# Patient Record
Sex: Male | Born: 1945 | Hispanic: Refuse to answer
Health system: Midwestern US, Academic
[De-identification: ages and names within clinical notes are randomized; demographics above are authoritative.]

---

## 2017-03-27 ENCOUNTER — Encounter: Admit: 2017-03-27 | Discharge: 2017-03-27 | Payer: MEDICARE

## 2017-03-27 DIAGNOSIS — K6289 Other specified diseases of anus and rectum: Principal | ICD-10-CM

## 2017-03-27 MED ORDER — PEG-ELECTROLYTE SOLN 420 GRAM PO SOLR
0 refills | Status: AC
Start: 2017-03-27 — End: ?

## 2017-03-27 NOTE — Telephone Encounter
Left message for pt to call office

## 2017-03-27 NOTE — Telephone Encounter
Spoke with pt and arranged lower EUS at [facility]  03/31/17

## 2017-04-09 ENCOUNTER — Encounter: Admit: 2017-04-09 | Discharge: 2017-04-09 | Payer: MEDICARE

## 2017-04-09 NOTE — Telephone Encounter
-----  Original Message-----  From: Edger HouseMelissa Oropeza-Vail   Sent: Tuesday, April 07, 2017 10:38 AM  To: Bernita Buffyarlene Torres; Harolyn RutherfordMalissa Ryli Standlee  Subject: RE: EUS Request: Joneen BoersDreyer, Marguis DOB 2046/06/24    this was already done 03/25/17    ________________________________________  From: Bernita Buffyarlene Torres  Sent: Tuesday, April 07, 2017 9:42 AM  To: Harolyn RutherfordMalissa Marasia Newhall  Cc: Edger HouseMelissa Oropeza-[place]  Subject: FW: EUS Request: [name], [name] DOB 2046/06/24    For Dr. Milta Deiterslyaee      -----Original Message-----  From: Estevan OaksJenn Eastburn  Sent: Tuesday, April 07, 2017 9:42 AM  To: Bernita Buffyarlene Torres  Cc: PhysicianConsult  Subject: EUS Request: [name], [name] DOB 2046/06/24        This email contains Medical Records and/or Insurance Referral information.  If you have any questions please call ext. [phone] or email me at [email or web address]    Please schedule an appointment for the following patient:    Patient name: Matthew Chang, Matthew Chang  D.O.B: 2046/06/24  Diagnosis: Rectal adenocarcinoma, EUS requested    Patient information or other types of sensitive information included in this e-mail is for the exclusive use of the named recipient. If you are not the designated recipient or a person authorized to receive this document or if you have obtained it in error, be advised that any reading, distribution, use or duplication of it is expressly prohibited. If the e-mail came to you by mistake, please notify the sender by phone immediately [phone]. [facility] is committed to protecting patient and/or other types of sensitive information.    Sending parties are expected to verify that the e-mail address on this document is being sent to is correct and that the stated recipient is authorized to receive the enclosed information.

## 2017-08-11 ENCOUNTER — Encounter: Admit: 2017-08-11 | Discharge: 2017-08-11 | Payer: MEDICARE

## 2017-08-11 ENCOUNTER — Ambulatory Visit: Admit: 2017-08-11 | Discharge: 2017-08-12 | Payer: MEDICARE

## 2017-08-11 DIAGNOSIS — I10 Essential (primary) hypertension: ICD-10-CM

## 2017-08-11 DIAGNOSIS — Z01810 Encounter for preprocedural cardiovascular examination: Principal | ICD-10-CM

## 2021-01-25 ENCOUNTER — Encounter: Admit: 2021-01-25 | Discharge: 2021-01-25 | Payer: MEDICARE

## 2021-01-25 DIAGNOSIS — E785 Hyperlipidemia, unspecified: Secondary | ICD-10-CM

## 2021-01-25 DIAGNOSIS — N19 Unspecified kidney failure: Secondary | ICD-10-CM

## 2021-01-25 DIAGNOSIS — I10 Essential (primary) hypertension: Secondary | ICD-10-CM

## 2021-01-25 DIAGNOSIS — Z87891 Personal history of nicotine dependence: Secondary | ICD-10-CM

## 2022-01-30 IMAGING — CT NECKWO
4 series · 8 of 16 positions shown, 9 images · non-contrast
Comparison: none

[Series 502: c-spine ax 2.00 br60 s3 · axial · 0.22mm/px · z∈[-719,-620]mm · 3 of 100 slices shown, 4 images]
[im 25/100  soft-tissue]
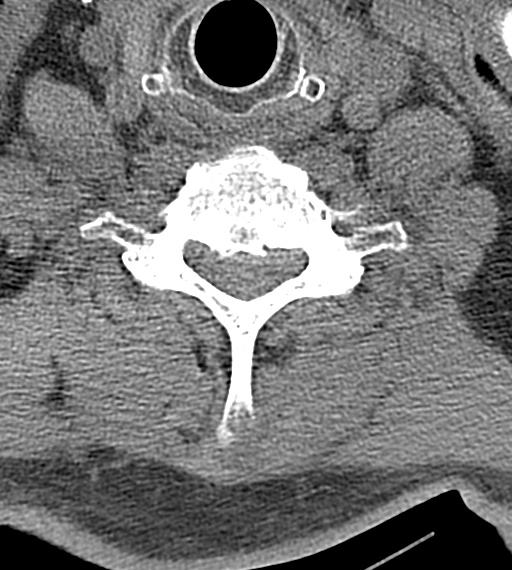
[im 25/100  bone]
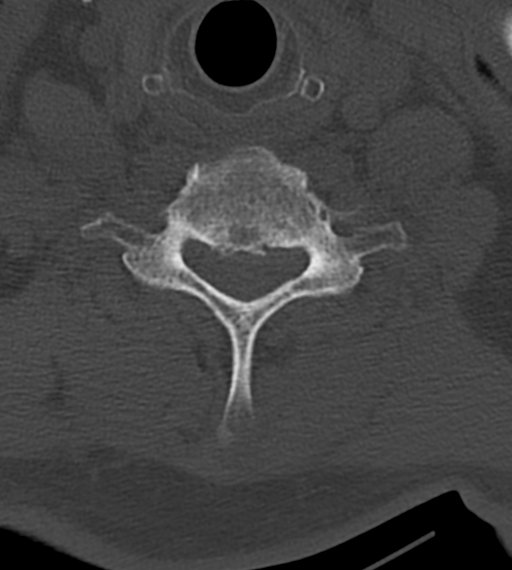
[im 50/100  soft-tissue]
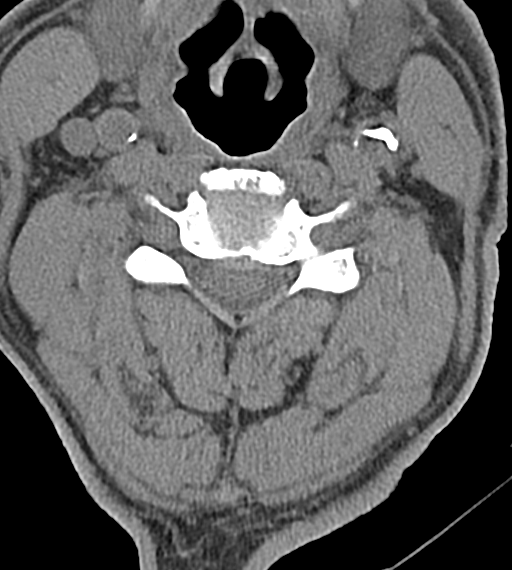
[im 75/100  soft-tissue]
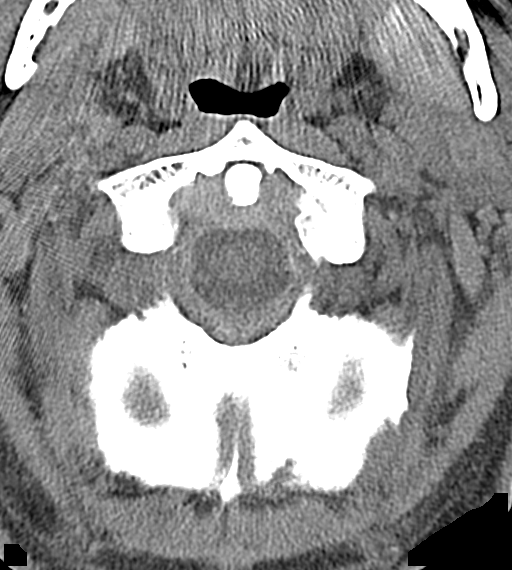

[Series 503: c-spine cor 2.00 br60 s3 · coronal · 0.22mm/px · 1 of 63 slices shown]
[im 32/63  soft-tissue]
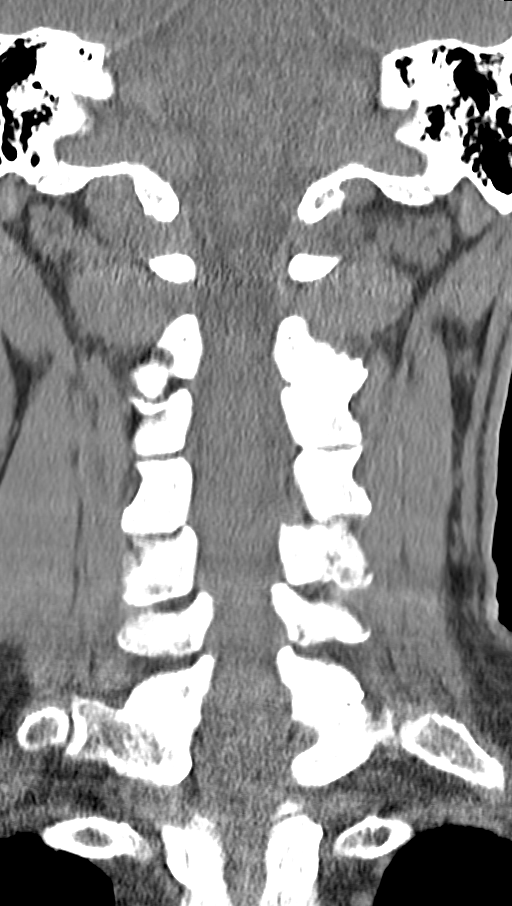

[Series 504: c-spine sag 2.00 br60 s3 · sagittal · 0.25mm/px · 1 of 57 slices shown]
[im 29/57  soft-tissue]
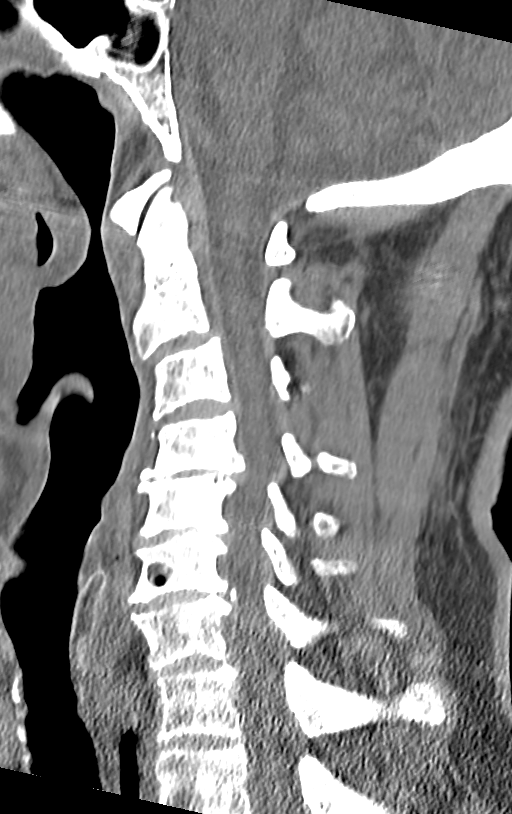

[Series 505: c-spine ax 2.00 br40 s3 · axial · 0.22mm/px · z∈[-718,-623]mm · 3 of 99 slices shown]
[im 25/99  soft-tissue]
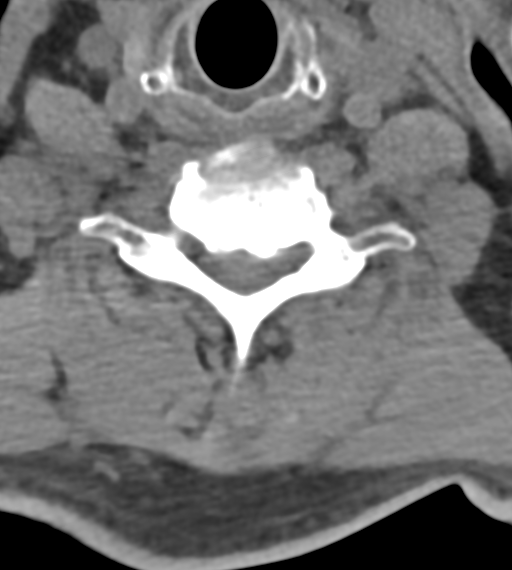
[im 50/99  soft-tissue]
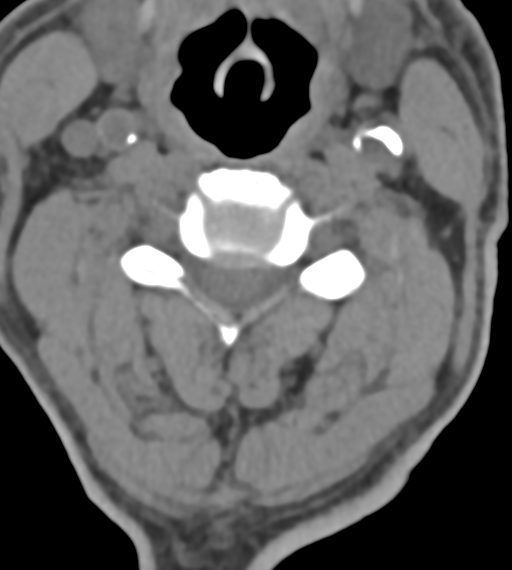
[im 74/99  soft-tissue]
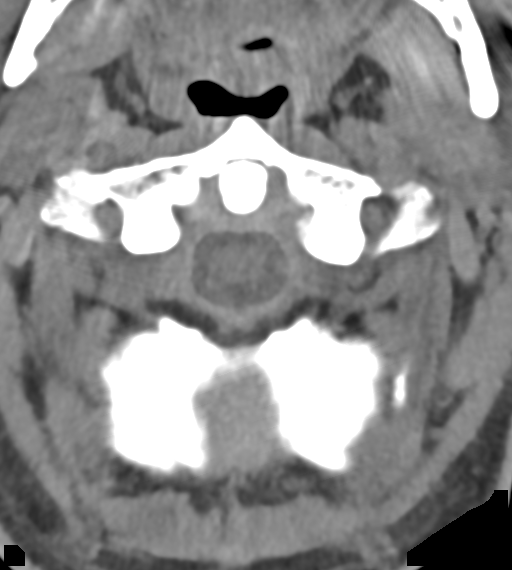

[8 of 16 positions shown; findings below may reference images not displayed]

EXAM

CT HEAD, CT of the cervical spine

INDICATION

Fall
Fall. Trauma. Pt in c-collar. CT/NM 0/0. CF/CS

TECHNIQUE

All CT scans at this facility use dose modulation, iterative reconstruction, and/or weight based
dosing when appropriate to reduce radiation dose to as low as reasonably achievable.

CT of the head without contrast was performed.

# of CT scans in the past year: 0

# of Myocardial perfusion scans this past year: 0

COMPARISONS

None available at the time of dictation

FINDINGS

Parenchyma: No acute hemorrhage.  There is no mass effect, midline shift, or herniation. There is
preservation of the gray white differentiation.

Ventricles / Extra-axial spaces: There is no hydrocephalus.  There are no extra-axial fluid
collections.

Other: The bony structures are intact. Minimal mucosal thickening of the right maxillary sinus.

Vertebral body heights are maintained. There is loss of normal cervical lordosis. Focal kyphosis
centered at C3. Decreased intervertebral disc space at the levels of C2-C3, C3-C4, C4-C5, C5-C6 and
C6-C7. Disc osteophyte complexes are seen at these levels as well. Facet arthropathy is noted. No
significant listhesis. The prevertebral soft tissues are within normal limits.

IMPRESSION
1. No CT evidence of an acute intracranial or cervical spine abnormality.
2. Severe degenerative disc disease within the cervical spine.

Tech Notes:

Fall. Trauma. Pt in c-collar. CT/NM 0/0. CF/CS

## 2022-02-02 IMAGING — US RETROPCM
1 series · 14 of 16 positions shown · non-contrast
Comparison: none

[Series 1: us renal/bladder complete · 14 of 45 slices shown]
[im 1/45]
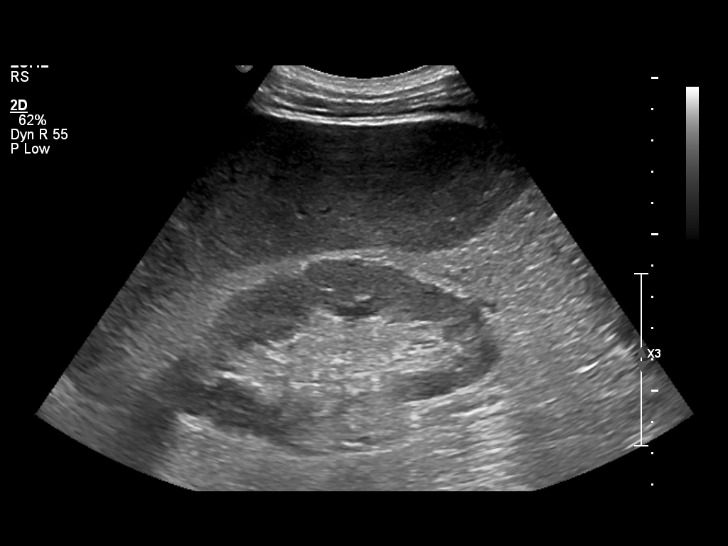
[im 3/45]
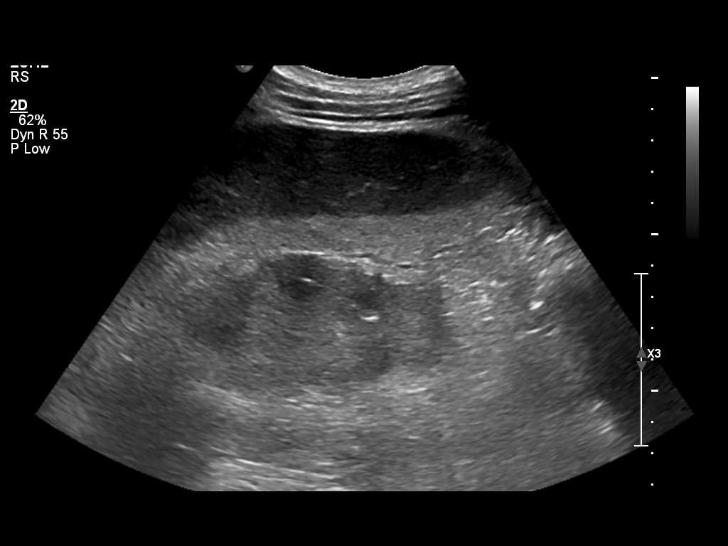
[im 6/45]
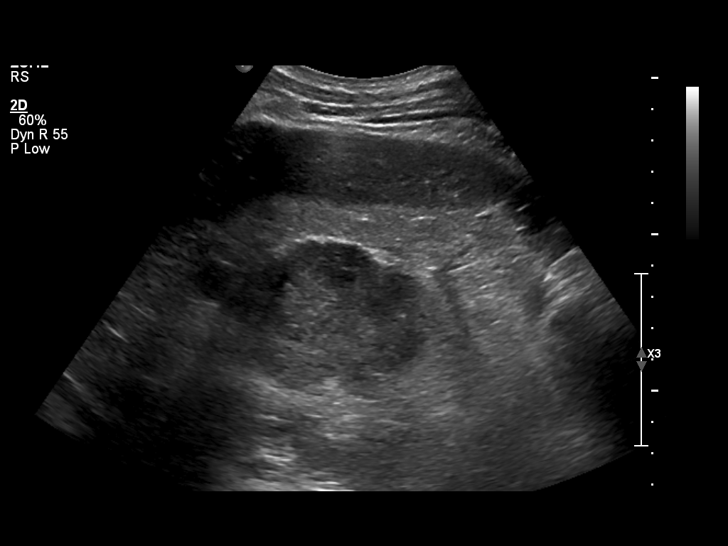
[im 12/45]
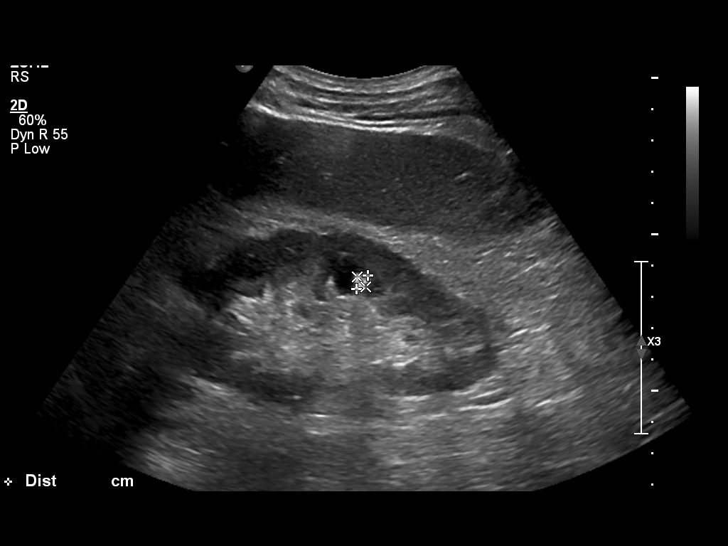
[im 15/45]
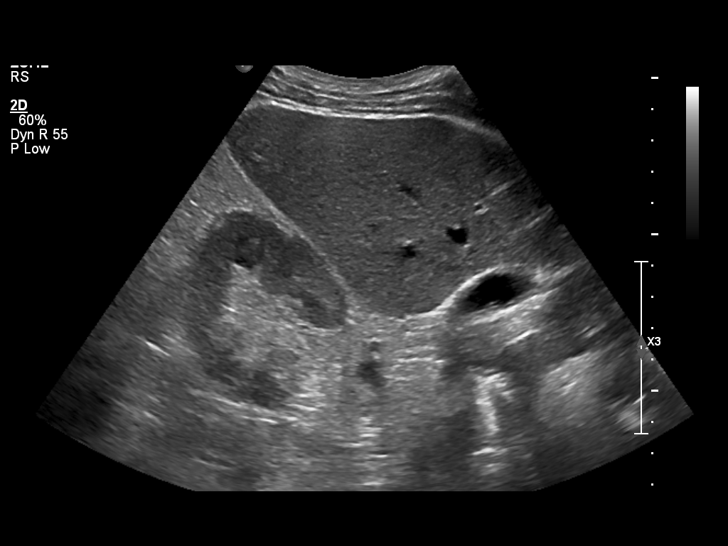
[im 18/45]
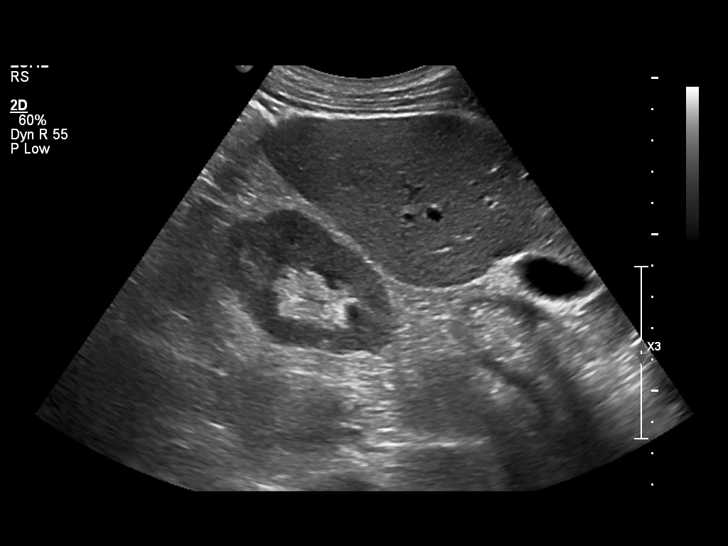
[im 21/45]
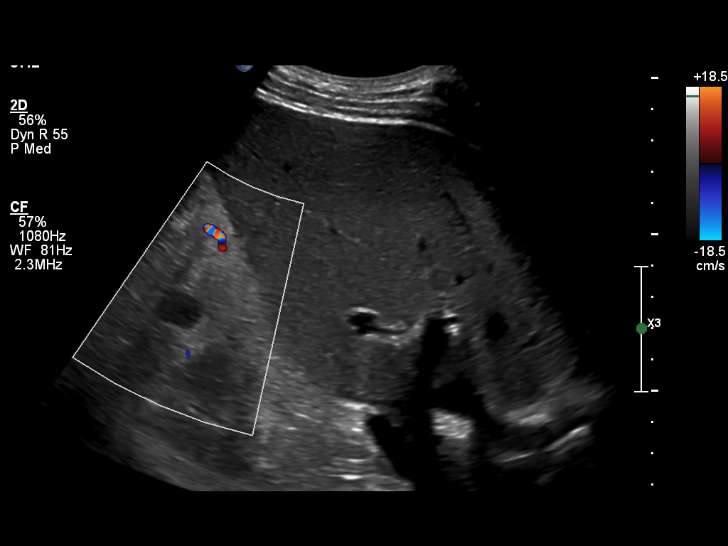
[im 24/45]
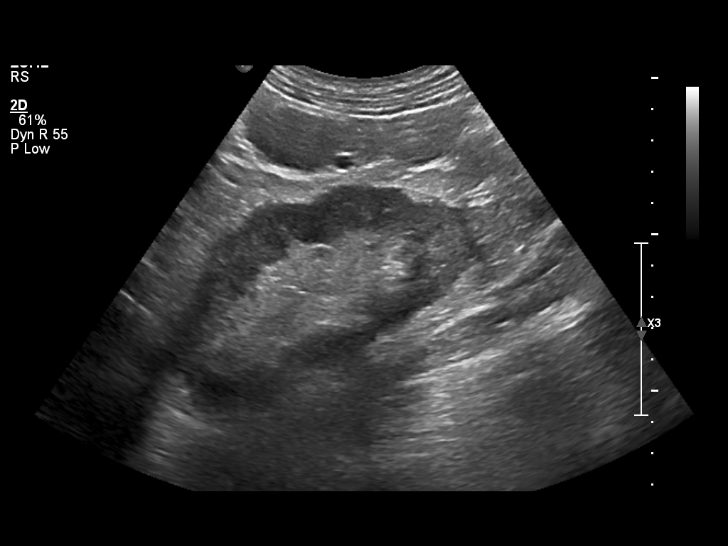
[im 27/45]
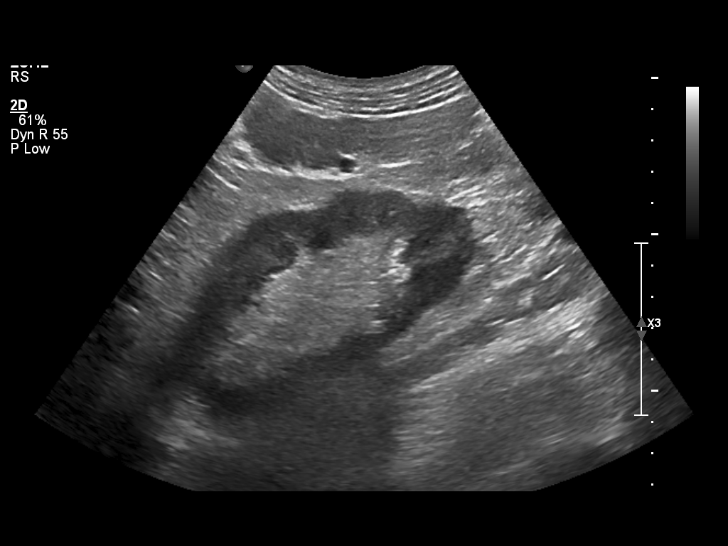
[im 30/45]
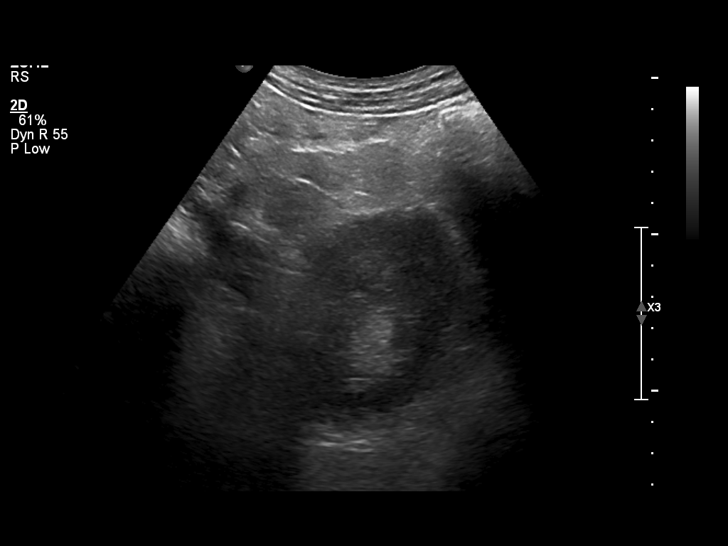
[im 36/45]
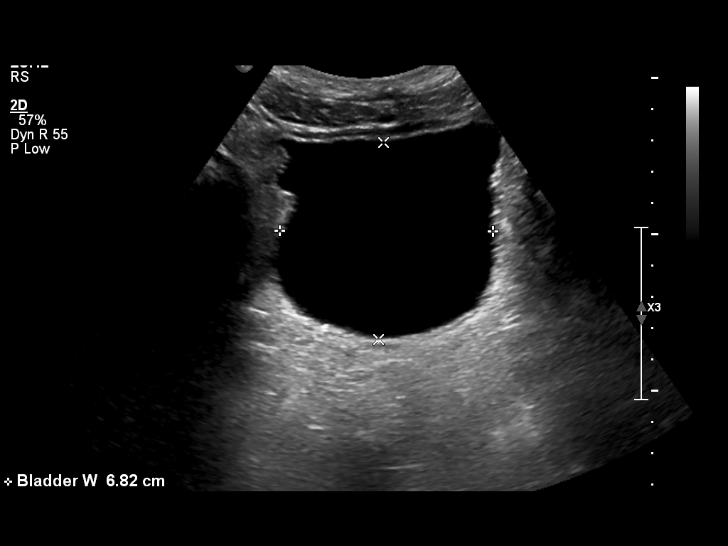
[im 39/45]
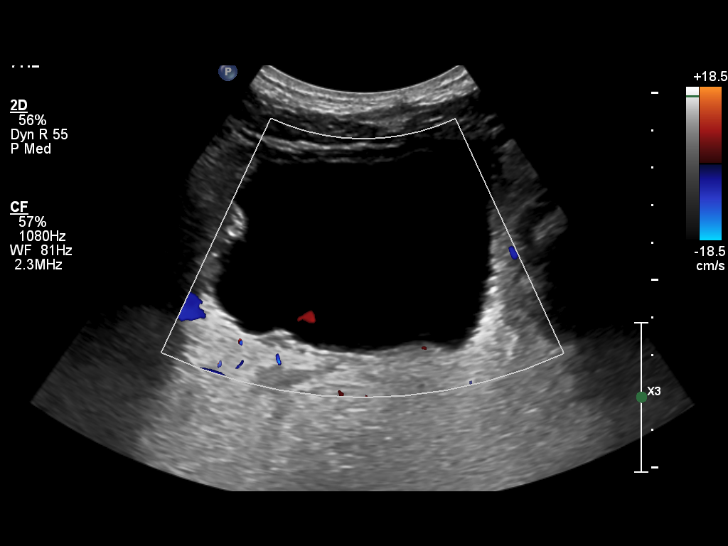
[im 42/45]
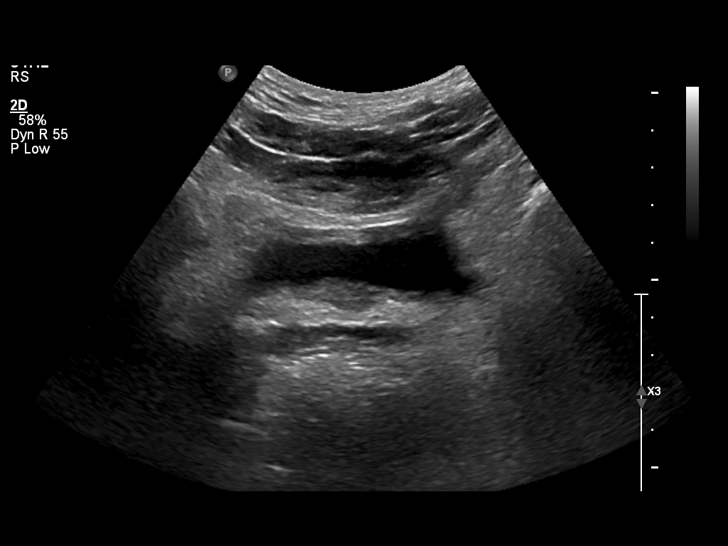
[im 45/45]
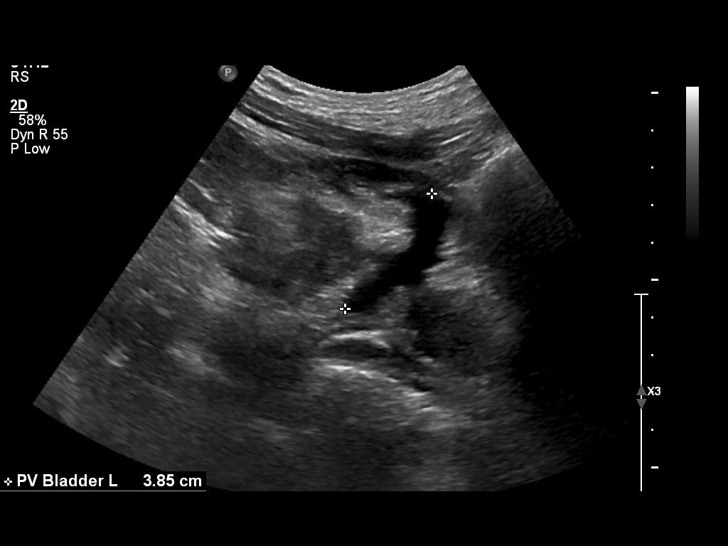

[14 of 16 positions shown; findings below may reference images not displayed]

EXAM

Renal ultrasound

INDICATION

CKD
CKD

TECHNIQUE

Grayscale and limited color Doppler

COMPARISONS

None available

FINDINGS

Right kidney: 10.9 x 6.1 x 4.9 cm. Right renal cortical thickness of 1.5 cm. No hydronephrosis.
1.3 cm simple exophytic upper pole cyst, no routine follow-up is recommended. 6 millimeter
nonobstructive stone in the midpole with mild shadowing.

Left kidney: 10.7 x 5.5 x 5.3 cm. Left renal cortical thickness of 1.2 cm. No hydronephrosis. No
renal mass or calculi identified.

Bladder: Prevoid volume of 175 milliliters. Bilateral ureteral jets are visualized.  Normal
emptying of the bladder.

IMPRESSION

No acute findings. Nonobstructive right renal calculus.

Tech Notes:

CKD

## 2022-02-23 IMAGING — CR [ID]
1 series · 1 of 1 positions shown · non-contrast
Comparison: none

[x chest ap]
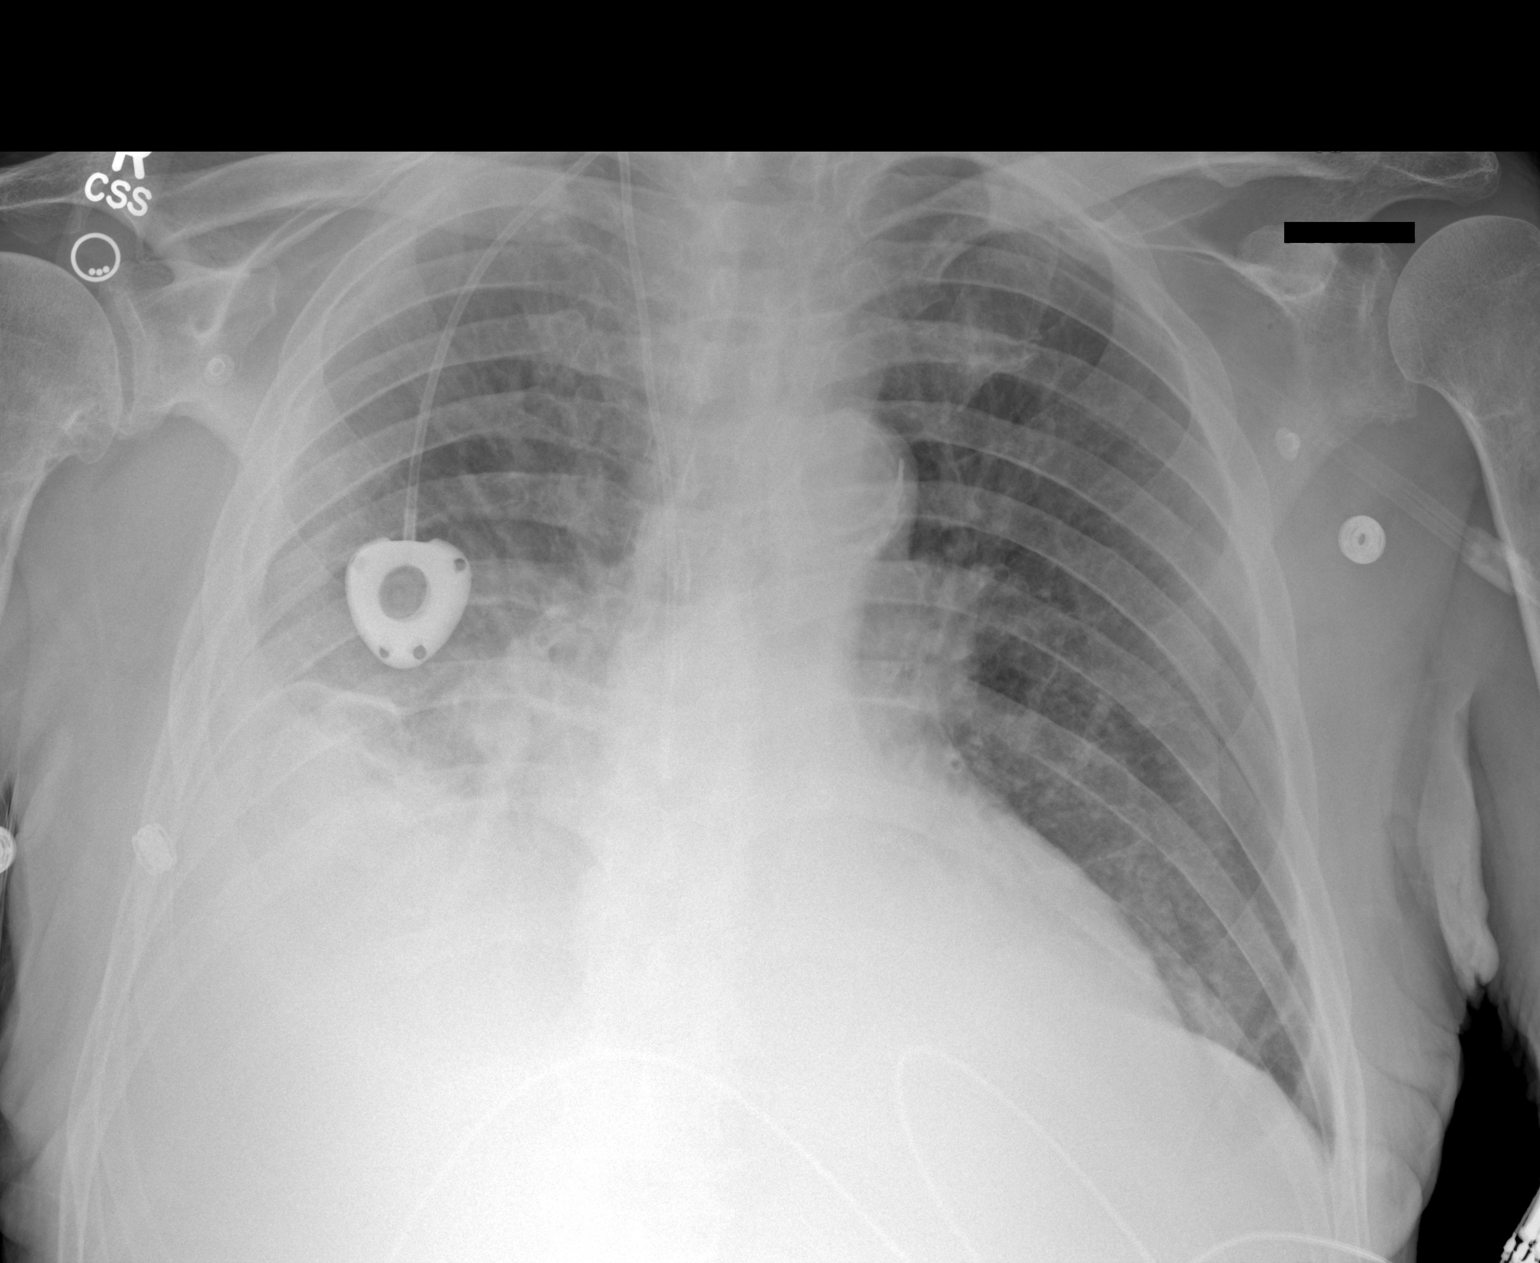

[1 of 1 positions shown; findings below may reference images not displayed]

DIAGNOSTIC STUDIES

EXAM

XR chest 1V

INDICATION

episode of SOA, recent PNA
Shortness of Air. Recently came off antibiotics for pneumonia.CS

TECHNIQUE

Portable AP chest

COMPARISONS

June 07, 2021

FINDINGS

Cardiac silhouette is unchanged with central venous catheter in place. Patient has developed a
right pleural effusion right basilar consolidation worrisome for pneumonia or aspiration. Possible
early left basilar infiltrate is also seen. Osseous structures are stable.

IMPRESSION

Moderate right pleural effusion with extensive consolidation right lung base and minimally in left
lung base concerning for underlying pneumonia or aspiration.

Tech Notes:

Shortness of Air. Recently came off antibiotics for pneumonia.CS
